# Patient Record
Sex: Male | Born: 1990 | Race: White | Hispanic: No | Marital: Single | Smoking: Former smoker
Health system: Southern US, Community
[De-identification: ages and names within clinical notes are randomized; demographics above are authoritative.]

## PROBLEM LIST (undated history)

## (undated) DIAGNOSIS — J9383 Other pneumothorax: Secondary | ICD-10-CM

## (undated) DIAGNOSIS — J45909 Unspecified asthma, uncomplicated: Secondary | ICD-10-CM

## (undated) DIAGNOSIS — T7840XA Allergy, unspecified, initial encounter: Secondary | ICD-10-CM

## (undated) DIAGNOSIS — J939 Pneumothorax, unspecified: Secondary | ICD-10-CM

---
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
## 2012-09-06 ENCOUNTER — Encounter (HOSPITAL_COMMUNITY): Payer: Self-pay | Admitting: *Deleted

## 2012-09-06 ENCOUNTER — Emergency Department (INDEPENDENT_AMBULATORY_CARE_PROVIDER_SITE_OTHER)
Admission: EM | Admit: 2012-09-06 | Discharge: 2012-09-06 | Disposition: A | Discharge status: Home / Self Care | Payer: Medicaid Other | Source: Home / Self Care

## 2012-09-06 DIAGNOSIS — A088 Other specified intestinal infections: Secondary | ICD-10-CM

## 2012-09-06 DIAGNOSIS — A084 Viral intestinal infection, unspecified: Secondary | ICD-10-CM

## 2012-09-06 MED ORDER — ONDANSETRON 4 MG PO TBDP
4.0000 mg | ORAL_TABLET | Freq: Once | ORAL | Status: AC
  Administered 2012-09-06: 4 mg via ORAL

## 2012-09-06 MED ORDER — ONDANSETRON 4 MG PO TBDP
ORAL_TABLET | ORAL | Status: AC
  Filled 2012-09-06: qty 1

## 2012-09-06 MED ORDER — ONDANSETRON HCL 4 MG PO TABS: 4.0000 mg | ORAL_TABLET | Freq: Four times a day (QID) | ORAL | Status: DC

## 2012-09-06 NOTE — ED Provider Notes (Signed)
Medical screening examination/treatment/procedure(s) were performed by non-physician practitioner and as supervising physician I was immediately available for consultation/collaboration.  Raynald Blend, MD 09/06/12 1718

## 2012-09-06 NOTE — ED Provider Notes (Signed)
History     CSN: [id number]  Arrival date & time 09/06/12  1312   First MD Initiated Contact with Patient 09/06/12 1405      Chief Complaint  Patient presents with  . Emesis    (Consider location/radiation/quality/duration/timing/severity/associated sxs/prior treatment) HPI Comments: 22 year old male presents with vomiting and diarrhea starting last p.m. He has had vomiting 6 times now and loose stools 7 times. He denies having fever, chills. But he does have malaise and fatigue. He is also complaining of generalized abdominal cramping. He denies sore throat, earache, fever. He also notes he has a chronic GI condition in which he has for loose stools almost on a daily basis. Sometimes he will have days of constipation.   History reviewed. No pertinent past medical history.  History reviewed. No pertinent past surgical history.  No family history on file.  History  Substance Use Topics  . Smoking status: Current Every Day Smoker  . Smokeless tobacco: Not on file  . Alcohol Use: Yes      Review of Systems  Constitutional: Positive for activity change and fatigue. Negative for fever and diaphoresis.  HENT: Negative for ear pain, congestion, sore throat, neck pain and neck stiffness.   Eyes: Negative.   Respiratory: Negative for cough, chest tightness and shortness of breath.   Cardiovascular: Negative.   Gastrointestinal: Positive for nausea, vomiting, abdominal pain and diarrhea. Negative for constipation and blood in stool.       As per history of present illness  Genitourinary: Negative.   Musculoskeletal: Negative.   Skin: Negative for color change and rash.  Neurological: Negative.   Psychiatric/Behavioral: Negative.  Negative for behavioral problems.    Allergies  Review of patient's allergies indicates no known allergies.  Home Medications   Current Outpatient Rx  Name  Route  Sig  Dispense  Refill  . ondansetron (ZOFRAN) 4 MG tablet   Oral   Take 1  tablet (4 mg total) by mouth every 6 (six) hours.   12 tablet   0     BP 118/72  Pulse 72  Temp(Src) 98.6 F (37 C) (Oral)  Resp 16  SpO2 100%  Physical Exam  Constitutional: He is oriented to person, place, and time. He appears well-developed and well-nourished. No distress.  Eyes: Conjunctivae and EOM are normal.  Neck: Normal range of motion. Neck supple.  Cardiovascular: Normal rate, regular rhythm and normal heart sounds.   Pulmonary/Chest: Effort normal and breath sounds normal. No respiratory distress. He has no wheezes. He has no rales.  Abdominal: Soft. He exhibits no distension and no mass. There is tenderness. There is guarding. There is no rebound.  Abdomen with mild to moderate generalized tenderness. He tends to guard with palpation. No localized tenderness over the right lower quadrant. Tenderness across the abdomen is the same without a focal point of tenderness.  Musculoskeletal: Normal range of motion. He exhibits no edema and no tenderness.  Lymphadenopathy:    He has no cervical adenopathy.  Neurological: He is alert and oriented to person, place, and time. No cranial nerve deficit. He exhibits normal muscle tone. Coordination normal.  Skin: Skin is warm and dry. No rash noted.  Psychiatric: He has a normal mood and affect.    ED Course  Procedures (including critical care time)  Labs Reviewed - No data to display No results found.   1. Viral gastroenteritis       MDM  Patient is discharged in stable condition. He is early  in the first day of vomiting and diarrhea associated with the local epidemic of viral gastroenteritis. Lunesta Zofran 4 mg by mouth now and a prescription for 4 mg every 6 hours as needed for nausea and vomiting. Imodium A-D 1 nail and then in 6 hours if still having greater than 4-5 stools per day. Do not take like the directions M. a box and did not take enough to stop the diarrhea. This is the way the body she is a virus. Clear  liquid diet for 24 hours and slowly advance as directed. Obtain a primary care doctor as soon as possible for followup. If worse, new symptoms or problems such as fever, increased pain, persistent and unrelenting and vomiting and diarrhea they need to return or go to emergency department.         Hayden Rasmussen, NP 09/06/12 1554

## 2012-09-06 NOTE — ED Notes (Signed)
Pt  Reports    Symptoms  Of  Nausea   Vomiting    Diarrhea    Since  yest   Vomited approx  5  X  Today  approx  6  Loose  Stools         Also has  A  Rash on l lower  Leg x  1  Year           He  Stated  He  Had         Scabies      approx  1  yearb  Ago    While  Incarcerated  In that  Area       But  He  Was  Treated

## 2014-07-02 ENCOUNTER — Encounter (HOSPITAL_COMMUNITY): Payer: Self-pay | Admitting: Emergency Medicine

## 2014-07-02 ENCOUNTER — Emergency Department (INDEPENDENT_AMBULATORY_CARE_PROVIDER_SITE_OTHER)
Admission: EM | Admit: 2014-07-02 | Discharge: 2014-07-02 | Disposition: A | Discharge status: Home / Self Care | Payer: Self-pay | Source: Home / Self Care | Attending: Emergency Medicine | Admitting: Emergency Medicine

## 2014-07-02 DIAGNOSIS — R112 Nausea with vomiting, unspecified: Secondary | ICD-10-CM

## 2014-07-02 LAB — COMPREHENSIVE METABOLIC PANEL
ALBUMIN: 4.9 g/dL (ref 3.5–5.2)
ALT: 21 U/L (ref 0–53)
AST: 21 U/L (ref 0–37)
Alkaline Phosphatase: 105 U/L (ref 39–117)
Anion gap: 10 (ref 5–15)
BUN: 14 mg/dL (ref 6–23)
CALCIUM: 9.9 mg/dL (ref 8.4–10.5)
CO2: 27 mmol/L (ref 19–32)
CREATININE: 0.98 mg/dL (ref 0.50–1.35)
Chloride: 102 mEq/L (ref 96–112)
GFR calc Af Amer: >90 mL/min (ref >90)
GFR calc non Af Amer: >90 mL/min (ref >90)
GLUCOSE: 97 mg/dL (ref 70–99)
POTASSIUM: 4.1 mmol/L (ref 3.5–5.1)
SODIUM: 139 mmol/L (ref 135–145)
Total Bilirubin: 1.2 mg/dL (ref 0.3–1.2)
Total Protein: 7.6 g/dL (ref 6.0–8.3)

## 2014-07-02 LAB — POCT H PYLORI SCREEN: H. PYLORI SCREEN, POC: NEGATIVE

## 2014-07-02 MED ORDER — ONDANSETRON 4 MG PO TBDP
4.0000 mg | ORAL_TABLET | Freq: Once | ORAL | Status: AC
  Administered 2014-07-02: 4 mg via ORAL

## 2014-07-02 MED ORDER — ONDANSETRON 4 MG PO TBDP
ORAL_TABLET | ORAL | Status: AC
  Filled 2014-07-02: qty 1

## 2014-07-02 MED ORDER — ONDANSETRON HCL 4 MG PO TABS: 4.0000 mg | ORAL_TABLET | Freq: Four times a day (QID) | ORAL | Status: DC

## 2014-07-02 NOTE — Discharge Instructions (Signed)
Take zofran every 6 hours as needed for vomiting. Avoid fatty foods. Please make an appointment with the GI doctor for additional evaluation.

## 2014-07-02 NOTE — ED Provider Notes (Signed)
CSN: [id number]     Arrival date & time 07/02/14  1018 History   First MD Initiated Contact with Patient 07/02/14 1103     Chief Complaint  Patient presents with  . Nausea   (Consider location/radiation/quality/duration/timing/severity/associated sxs/prior Treatment) HPI  He is a 24 year old man here for evaluation of nausea and vomiting. He states this started last night with vomiting and diffuse abdominal pain. There is no blood in the vomit. He also reports to watery bowel movements this morning. No fevers or chills. He states this is a recurrent issue over the last 4 years. Recently, it has been occurring more frequently, at least once a month. Episodes typically last a few days. He states it seems to be triggered by greasy and fatty foods.  He also states he will often be constipated for the week before an episode occurs.  He denies any blood in the stool or weight loss.  History reviewed. No pertinent past medical history. History reviewed. No pertinent past surgical history. No family history on file. History  Substance Use Topics  . Smoking status: Current Every Day Smoker  . Smokeless tobacco: Not on file  . Alcohol Use: Yes    Review of Systems  Constitutional: Negative for fever, chills and unexpected weight change.  Gastrointestinal: Positive for nausea, vomiting, abdominal pain, diarrhea and constipation.    Allergies  Review of patient's allergies indicates no known allergies.  Home Medications   Prior to Admission medications   Medication Sig Start Date End Date Taking? Authorizing Provider  ondansetron (ZOFRAN) 4 MG tablet Take 1 tablet (4 mg total) by mouth every 6 (six) hours. 07/02/14   Charm RingsErin J Gaberiel Youngblood, MD   BP 126/69 mmHg  Pulse 60  Temp(Src) 98.1 F (36.7 C) (Oral)  Resp 14  SpO2 100% Physical Exam  Constitutional: He is oriented to person, place, and time. He appears well-developed and well-nourished.  Cardiovascular: Normal rate, regular rhythm and normal  heart sounds.   Pulmonary/Chest: Effort normal and breath sounds normal. No respiratory distress. He has no wheezes. He has no rales.  Abdominal: Soft. Bowel sounds are normal. He exhibits no distension. There is tenderness (diffuse). There is no rebound and no guarding.  Negative Murphy's and McBurneys.  Neurological: He is alert and oriented to person, place, and time.    ED Course  Procedures (including critical care time) Labs Review Labs Reviewed  COMPREHENSIVE METABOLIC PANEL  POCT H PYLORI SCREEN    Imaging Review No results found.   MDM   1. Non-intractable vomiting with nausea, vomiting of unspecified type    Treated with Zofran 4 mg ODT here with some improvement.  Blood work is all normal.  Suspect he may have some sort of food sensitivity or gallbladder issue with this recurrent abdominal pain with nausea and vomiting. Zofran as needed at home. Follow-up with GI doctor for additional evaluation.  Meds ordered this encounter  Medications  . ondansetron (ZOFRAN-ODT) disintegrating tablet 4 mg    Sig:   . ondansetron (ZOFRAN) 4 MG tablet    Sig: Take 1 tablet (4 mg total) by mouth every 6 (six) hours.    Dispense:  20 tablet    Refill:  0     Charm RingsErin J Ziyah Cordoba, MD 07/02/14 1301

## 2014-07-02 NOTE — ED Notes (Signed)
Abdominal pain, nausea, and vomiting.  Sometimes has diarrhea with episodes.  Reports having repeated episodes of these complaints for 4 years.  Reports they occur every 2 weeks.  This episode started last night

## 2015-03-13 ENCOUNTER — Encounter (HOSPITAL_COMMUNITY): Payer: Self-pay | Admitting: Emergency Medicine

## 2015-03-13 ENCOUNTER — Emergency Department (INDEPENDENT_AMBULATORY_CARE_PROVIDER_SITE_OTHER)
Admission: EM | Admit: 2015-03-13 | Discharge: 2015-03-13 | Disposition: A | Discharge status: Home / Self Care | Payer: Self-pay | Source: Home / Self Care | Attending: Emergency Medicine | Admitting: Emergency Medicine

## 2015-03-13 DIAGNOSIS — R0789 Other chest pain: Secondary | ICD-10-CM

## 2015-03-13 DIAGNOSIS — S29011A Strain of muscle and tendon of front wall of thorax, initial encounter: Secondary | ICD-10-CM

## 2015-03-13 MED ORDER — DICLOFENAC POTASSIUM 50 MG PO TABS: 50.0000 mg | ORAL_TABLET | Freq: Three times a day (TID) | ORAL | Status: DC

## 2015-03-13 NOTE — ED Provider Notes (Signed)
CSN: [id number]     Arrival date & time 03/13/15  1728 History   First MD Initiated Contact with Patient 03/13/15 1757     Chief Complaint  Patient presents with  . Chest Pain   (Consider location/radiation/quality/duration/timing/severity/associated sxs/prior Treatment) HPI Comments: 24 year old male complaining of right chest pain starts at the [facility] and radiates toward the right. Pain is worse with palpation and movement of the arm and in particular abduction. It is worse with movement of the torso and the right arm. Started 2-3 days ago. It does not hurt with taking a deep breath. It is not associated with shortness of breath, diaphoresis, nausea or abdominal pain. His job does require use of the right arm frequently and occasionally exercises.  Patient is a 24 y.o. male presenting with chest pain. The history is provided by the patient.  Chest Pain Pain location:  R chest and R lateral chest Pain quality: aching, sharp and stabbing   Pain radiates to:  Does not radiate Pain radiates to the back: no   Pain severity:  Moderate Onset quality:  Gradual Duration:  2 days Timing:  Constant Progression:  Worsening Chronicity:  New Context: lifting, movement and raising an arm   Relieved by:  None tried Worsened by:  Movement Associated symptoms: no abdominal pain, no back pain, no cough, no dizziness, no fatigue, no fever, no palpitations, no shortness of breath, no syncope and not vomiting     History reviewed. No pertinent past medical history. History reviewed. No pertinent past surgical history. No family history on file. Social History  Substance Use Topics  . Smoking status: Current Every Day Smoker  . Smokeless tobacco: None  . Alcohol Use: Yes    Review of Systems  Constitutional: Negative for fever and fatigue.  HENT: Negative.   Respiratory: Negative for cough and shortness of breath.   Cardiovascular: Positive for chest pain. Negative for palpitations and  syncope.  Gastrointestinal: Negative for vomiting and abdominal pain.  Musculoskeletal: Positive for myalgias. Negative for back pain.  Skin: Negative.   Neurological: Negative for dizziness.  All other systems reviewed and are negative.   Allergies  Review of patient's allergies indicates no known allergies.  Home Medications   Prior to Admission medications   Medication Sig Start Date End Date Taking? Authorizing Provider  diclofenac (CATAFLAM) 50 MG tablet Take 1 tablet (50 mg total) by mouth 3 (three) times daily. One tablet TID with food prn pain. 03/13/15   Hayden Rasmussen, NP  ondansetron (ZOFRAN) 4 MG tablet Take 1 tablet (4 mg total) by mouth every 6 (six) hours. 07/02/14   Charm Rings, MD   Meds Ordered and Administered this Visit  Medications - No data to display  BP 114/77 mmHg  Pulse 52  Temp(Src) 98.1 F (36.7 C) (Oral)  Resp 16  SpO2 100% No data found.   Physical Exam  Constitutional: He appears well-developed and well-nourished. No distress.  Neck: Normal range of motion. Neck supple.  Cardiovascular: Normal rate, regular rhythm and normal heart sounds.   Pulmonary/Chest: Effort normal and breath sounds normal. No respiratory distress. He exhibits tenderness.  Reproducible Chest wall tenderness from the right sternal border across the right anterior chest. Pain is reproduced with right arm adduction.  Musculoskeletal: He exhibits no edema.  Neurological: No cranial nerve deficit. He exhibits normal muscle tone.  Skin: Skin is warm and dry.  Nursing note and vitals reviewed.   ED Course  Procedures (including critical care time)  Labs Review Labs Reviewed - No data to display  Imaging Review No results found.   Visual Acuity Review  Right Eye Distance:   Left Eye Distance:   Bilateral Distance:    Right Eye Near:   Left Eye Near:    Bilateral Near:         MDM   1. Right-sided chest wall pain   2. Muscle strain of chest wall, initial  encounter    Ice several times a day cataflam 50 mg as dir Limit activity for a few days until better    Hayden Rasmussen, NP 03/13/15 1832

## 2015-03-13 NOTE — Discharge Instructions (Signed)
Chest Wall Pain °Chest wall pain is pain in or around the bones and muscles of your chest. It may take up to 6 weeks to get better. It may take longer if you must stay physically active in your work and activities.  °CAUSES  °Chest wall pain may happen on its own. However, it may be caused by: °· A viral illness like the flu. °· Injury. °· Coughing. °· Exercise. °· Arthritis. °· Fibromyalgia. °· Shingles. °HOME CARE INSTRUCTIONS  °· Avoid overtiring physical activity. Try not to strain or perform activities that cause pain. This includes any activities using your chest or your abdominal and side muscles, especially if heavy weights are used. °· Put ice on the sore area. °· Put ice in a plastic bag. °· Place a towel between your skin and the bag. °· Leave the ice on for 15-20 minutes per hour while awake for the first 2 days. °· Only take over-the-counter or prescription medicines for pain, discomfort, or fever as directed by your caregiver. °SEEK IMMEDIATE MEDICAL CARE IF:  °· Your pain increases, or you are very uncomfortable. °· You have a fever. °· Your chest pain becomes worse. °· You have new, unexplained symptoms. °· You have nausea or vomiting. °· You feel sweaty or lightheaded. °· You have a cough with phlegm (sputum), or you cough up blood. °MAKE SURE YOU:  °· Understand these instructions. °· Will watch your condition. °· Will get help right away if you are not doing well or get worse. °Document Released: 06/04/2005 Document Revised: 08/27/2011 Document Reviewed: 01/29/2011 °ExitCare® Patient Information ©2015 [employer]. This information is not intended to replace advice given to you by your health care provider. Make sure you discuss any questions you have with your health care provider. ° °Musculoskeletal Pain °Musculoskeletal pain is muscle and boney aches and pains. These pains can occur in any part of the body. Your caregiver may treat you without knowing the cause of the pain. They may treat you  if blood or urine tests, X-rays, and other tests were normal.  °CAUSES °There is often not a definite cause or reason for these pains. These pains may be caused by a type of germ (virus). The discomfort may also come from overuse. Overuse includes working out too hard when your body is not fit. Boney aches also come from weather changes. Bone is sensitive to atmospheric pressure changes. °HOME CARE INSTRUCTIONS  °· Ask when your test results will be ready. Make sure you get your test results. °· Only take over-the-counter or prescription medicines for pain, discomfort, or fever as directed by your caregiver. If you were given medications for your condition, do not drive, operate machinery or power tools, or sign legal documents for 24 hours. Do not drink alcohol. Do not take sleeping pills or other medications that may interfere with treatment. °· Continue all activities unless the activities cause more pain. When the pain lessens, slowly resume normal activities. Gradually increase the intensity and duration of the activities or exercise. °· During periods of severe pain, bed rest may be helpful. Lay or sit in any position that is comfortable. °· Putting ice on the injured area. °¨ Put ice in a bag. °¨ Place a towel between your skin and the bag. °¨ Leave the ice on for 15 to 20 minutes, 3 to 4 times a day. °· Follow up with your caregiver for continued problems and no reason can be found for the pain. If the pain becomes worse   or does not go away, it may be necessary to repeat tests or do additional testing. Your caregiver may need to look further for a possible cause. °SEEK IMMEDIATE MEDICAL CARE IF: °· You have pain that is getting worse and is not relieved by medications. °· You develop chest pain that is associated with shortness or breath, sweating, feeling sick to your stomach (nauseous), or throw up (vomit). °· Your pain becomes localized to the abdomen. °· You develop any new symptoms that seem different  or that concern you. °MAKE SURE YOU:  °· Understand these instructions. °· Will watch your condition. °· Will get help right away if you are not doing well or get worse. °Document Released: 06/04/2005 Document Revised: 08/27/2011 Document Reviewed: 02/06/2013 °ExitCare® Patient Information ©2015 [employer]. This information is not intended to replace advice given to you by your health care provider. Make sure you discuss any questions you have with your health care provider. ° °

## 2015-03-13 NOTE — ED Notes (Signed)
Pain in center chest that spreads to the right side of chest.  Onset 2 days ago.  Patient was gradual at first, now has grown in intensity, gradually over the past few days.  No specific injury.  Patient works at [employer] and works out occasionally.  Pain in chest worsens with movement of torso or in attempt to raise right arm.

## 2016-11-04 ENCOUNTER — Emergency Department (HOSPITAL_COMMUNITY)
Admission: EM | Admit: 2016-11-04 | Discharge: 2016-11-04 | Disposition: A | Discharge status: Home / Self Care | Payer: Medicaid Other | Attending: Emergency Medicine | Admitting: Emergency Medicine

## 2016-11-04 ENCOUNTER — Encounter (HOSPITAL_COMMUNITY): Payer: Self-pay | Admitting: Emergency Medicine

## 2016-11-04 DIAGNOSIS — Z79899 Other long term (current) drug therapy: Secondary | ICD-10-CM | POA: Insufficient documentation

## 2016-11-04 DIAGNOSIS — L02411 Cutaneous abscess of right axilla: Secondary | ICD-10-CM | POA: Insufficient documentation

## 2016-11-04 DIAGNOSIS — F17200 Nicotine dependence, unspecified, uncomplicated: Secondary | ICD-10-CM | POA: Insufficient documentation

## 2016-11-04 MED ORDER — HYDROCODONE-ACETAMINOPHEN 5-325 MG PO TABS
1.0000 | ORAL_TABLET | Freq: Once | ORAL | Status: AC
  Administered 2016-11-04: 1 via ORAL
  Filled 2016-11-04: qty 1

## 2016-11-04 MED ORDER — SULFAMETHOXAZOLE-TRIMETHOPRIM 800-160 MG PO TABS: 1.0000 | ORAL_TABLET | Freq: Two times a day (BID) | ORAL | 0 refills | Status: AC

## 2016-11-04 MED ORDER — LIDOCAINE HCL (PF) 1 % IJ SOLN
5.0000 mL | Freq: Once | INTRAMUSCULAR | Status: AC
  Administered 2016-11-04: 5 mL
  Filled 2016-11-04: qty 5

## 2016-11-04 NOTE — ED Triage Notes (Signed)
Pt. Stated, I have an abscess under rt. Arm for almost a week.

## 2016-11-04 NOTE — ED Provider Notes (Signed)
[facility]-EMERGENCY DEPT Provider Note   CSN: [id number] Arrival date & time: 11/04/16  0757     History   Chief Complaint Chief Complaint  Patient presents with  . Abscess    HPI Jonathan Wilkins is a 26 y.o. male with no past medical history presenting with worsening abscess under of his right axilla. He has attempted to squeeze it and drain it himself without success. He reports improvement with hot showers. The pain has caused him to have difficulty lifting his arm all the way up above his head. He has tried Aleve without relief. Denies IV drug use, fever, chills, or other symptoms.  HPI  History reviewed. No pertinent past medical history.  There are no active problems to display for this patient.   History reviewed. No pertinent surgical history.     Home Medications    Prior to Admission medications   Medication Sig Start Date End Date Taking? Authorizing Provider  diclofenac (CATAFLAM) 50 MG tablet Take 1 tablet (50 mg total) by mouth 3 (three) times daily. One tablet TID with food prn pain. 03/13/15   Hayden RasmussenMabe, David, NP  ondansetron (ZOFRAN) 4 MG tablet Take 1 tablet (4 mg total) by mouth every 6 (six) hours. 07/02/14   Charm RingsHonig, Erin J, MD  sulfamethoxazole-trimethoprim (BACTRIM DS,SEPTRA DS) 800-160 MG tablet Take 1 tablet by mouth 2 (two) times daily. 11/04/16 11/11/16  Georgiana ShoreMitchell, Vernesha Talbot B, PA-C    Family History No family history on file.  Social History Social History  Substance Use Topics  . Smoking status: Current Every Day Smoker  . Smokeless tobacco: Current User  . Alcohol use Yes     Allergies   Patient has no known allergies.   Review of Systems Review of Systems  Constitutional: Negative for chills, fatigue and fever.  Respiratory: Negative for shortness of breath, wheezing and stridor.   Cardiovascular: Negative for chest pain and palpitations.  Gastrointestinal: Negative for nausea and vomiting.  Musculoskeletal: Positive for myalgias.  Negative for neck pain and neck stiffness.  Skin: Positive for color change. Negative for pallor and rash.  Neurological: Negative for dizziness, weakness, light-headedness and numbness.     Physical Exam Updated Vital Signs BP 112/86 (BP Location: Left Arm)   Pulse 82   Temp 97.9 F (36.6 C) (Oral)   Resp 18   Ht 5\' 9"  (1.753 m)   SpO2 98%   Physical Exam  Constitutional: He appears well-developed and well-nourished. No distress.  Afebrile, nontoxic-appearing, lying comfortably in bed in no acute distress.  HENT:  Head: Normocephalic and atraumatic.  Eyes: Conjunctivae and EOM are normal. Right eye exhibits no discharge. Left eye exhibits no discharge.  Neck: Normal range of motion.  Cardiovascular: Normal rate, regular rhythm, normal heart sounds and intact distal pulses.   No murmur heard. Pulmonary/Chest: Effort normal and breath sounds normal. No respiratory distress.  Abdominal: He exhibits no distension.  Musculoskeletal: He exhibits tenderness. He exhibits no edema or deformity.  Difficulty raising his arm above his head due to pain  Neurological: He is alert. No sensory deficit.  Skin: Skin is warm and dry. No rash noted. He is not diaphoretic. There is erythema. No pallor.  Right axilla erythema and induration with central region of fluctuance and TTP  Psychiatric: He has a normal mood and affect.  Nursing note and vitals reviewed.    ED Treatments / Results  Labs (all labs ordered are listed, but only abnormal results are displayed) Labs Reviewed - No  data to display  EKG  EKG Interpretation None       Radiology No results found.  Procedures Procedures (including critical care time) EMERGENCY DEPARTMENT US SOFT TISSUE INTERPRETATION "Study: Limited Soft Tissue Ultrasound"  INDICATIONS: Soft tissue infection Multiple views of the body part were obtained in real-time with a multi-frequency linear probe  PERFORMED BY: Myself IMAGES ARCHIVED?:  Yes SIDE:Right  BODY PART:Axilla INTERPRETATION:  Abcess present and Cellulitis present  INCISION AND DRAINAGE Performed by: Georgiana Shore Consent: Verbal consent obtained. Risks and benefits: risks, benefits and alternatives were discussed Type: abscess  Body area: Right axilla  Anesthesia: local infiltration  Incision was made with a scalpel.  Local anesthetic: lidocaine 1% Without epinephrine  Anesthetic total: 2 ml  Complexity: complex Blunt dissection to break up loculations  Drainage: purulent  Drainage amount: 5mL  Packing material: 1/4 in iodoform gauze  Patient tolerance: Patient tolerated the procedure well with no immediate complications.    Patient tolerance: Patient tolerated the procedure well with no immediate complications. Medications Ordered in ED Medications  HYDROcodone-acetaminophen (NORCO/VICODIN) 5-325 MG per tablet 1 tablet (1 tablet Oral Given 11/04/16 0920)  lidocaine (PF) (XYLOCAINE) 1 % injection 5 mL (5 mLs Infiltration Given 11/04/16 0920)     Initial Impression / Assessment and Plan / ED Course  I have reviewed the triage vital signs and the nursing notes.  Pertinent labs & imaging results that were available during my care of the patient were reviewed by me and considered in my medical decision making (see chart for details).    Patient presented with right axilla abscess and surrounding cellulitis (~4x3cm) visualized on ultrasound. No circumferential cellulitis of upper extremity.   Incision and drainage performed. Patient reported significant improvement in pain after I&D in pain and pressure and was able to move his arm again.  Will discharge home with antibiotics and close follow-up for wound recheck in 48 hours. Patient was otherwise well-appearing, nontoxic and afebrile. No co-morbidities to affect wound healing.  Patient with skin abscess amenable to incision and drainage.  Abscess was large enough to warrant packing,   wound recheck in 2 days. Encouraged home warm soaks and flushing.  Moderate signs of cellulitis is surrounding skin.  Will d/c to home.   Discussed strict return precautions and advised to return to the emergency department if experiencing any new or worsening symptoms. Instructions were understood and patient agreed with discharge plan.   Final Clinical Impressions(s) / ED Diagnoses   Final diagnoses:  Abscess of axilla, right    New Prescriptions Discharge Medication List as of 11/04/2016  9:55 AM    START taking these medications   Details  sulfamethoxazole-trimethoprim (BACTRIM DS,SEPTRA DS) 800-160 MG tablet Take 1 tablet by mouth 2 (two) times daily., Starting Sun 11/04/2016, Until Sun 11/11/2016, Print         Georgiana Shore, PA-C 11/04/16 1239    Mancel Bale, MD 11/04/16 [phone]

## 2016-11-04 NOTE — Discharge Instructions (Signed)
As discussed, Keep the area clean and dry. You may start using warm compresses after 24 hours. Have wound rechecked in 48 hours. Take your antibiotics as prescribed even if you feel better. Ibuprofen or Tylenol for pain.  Return to the emergency department if he experienced fever, chills, worsening pain, swelling, purulent discharge, or any other new concerning symptoms in the meantime.

## 2017-06-26 ENCOUNTER — Inpatient Hospital Stay (HOSPITAL_BASED_OUTPATIENT_CLINIC_OR_DEPARTMENT_OTHER)
Admission: EM | Admit: 2017-06-26 | Discharge: 2017-06-28 | DRG: 200 | Disposition: A | Discharge status: Home / Self Care | Payer: Self-pay | Attending: Thoracic Surgery (Cardiothoracic Vascular Surgery) | Admitting: Thoracic Surgery (Cardiothoracic Vascular Surgery)

## 2017-06-26 ENCOUNTER — Other Ambulatory Visit: Payer: Self-pay

## 2017-06-26 ENCOUNTER — Encounter (HOSPITAL_BASED_OUTPATIENT_CLINIC_OR_DEPARTMENT_OTHER): Payer: Self-pay | Admitting: *Deleted

## 2017-06-26 ENCOUNTER — Emergency Department (HOSPITAL_BASED_OUTPATIENT_CLINIC_OR_DEPARTMENT_OTHER): Payer: Self-pay

## 2017-06-26 DIAGNOSIS — F17290 Nicotine dependence, other tobacco product, uncomplicated: Secondary | ICD-10-CM | POA: Diagnosis present

## 2017-06-26 DIAGNOSIS — J9383 Other pneumothorax: Principal | ICD-10-CM

## 2017-06-26 DIAGNOSIS — T797XXA Traumatic subcutaneous emphysema, initial encounter: Secondary | ICD-10-CM | POA: Diagnosis present

## 2017-06-26 DIAGNOSIS — J939 Pneumothorax, unspecified: Secondary | ICD-10-CM

## 2017-06-26 DIAGNOSIS — Z09 Encounter for follow-up examination after completed treatment for conditions other than malignant neoplasm: Secondary | ICD-10-CM

## 2017-06-26 DIAGNOSIS — R001 Bradycardia, unspecified: Secondary | ICD-10-CM | POA: Diagnosis present

## 2017-06-26 DIAGNOSIS — X58XXXA Exposure to other specified factors, initial encounter: Secondary | ICD-10-CM | POA: Diagnosis present

## 2017-06-26 DIAGNOSIS — J9311 Primary spontaneous pneumothorax: Secondary | ICD-10-CM

## 2017-06-26 HISTORY — DX: Other pneumothorax: J93.83

## 2017-06-26 HISTORY — DX: Unspecified asthma, uncomplicated: J45.909

## 2017-06-26 HISTORY — PX: CHEST TUBE INSERTION: SHX231

## 2017-06-26 LAB — BASIC METABOLIC PANEL
ANION GAP: 12 (ref 5–15)
BUN: 18 mg/dL (ref 6–20)
CHLORIDE: 103 mmol/L (ref 101–111)
CO2: 23 mmol/L (ref 22–32)
Calcium: 10 mg/dL (ref 8.9–10.3)
Creatinine, Ser: 0.92 mg/dL (ref 0.61–1.24)
GFR calc non Af Amer: >60 mL/min (ref >60)
Glucose, Bld: 90 mg/dL (ref 65–99)
POTASSIUM: 3.6 mmol/L (ref 3.5–5.1)
Sodium: 138 mmol/L (ref 135–145)

## 2017-06-26 LAB — CBC WITH DIFFERENTIAL/PLATELET
Basophils Absolute: 0 10*3/uL (ref 0.0–0.1)
Basophils Relative: 0 %
Eosinophils Absolute: 0.1 10*3/uL (ref 0.0–0.7)
Eosinophils Relative: 1 %
HCT: 47.4 % (ref 39.0–52.0)
HEMOGLOBIN: 16.4 g/dL (ref 13.0–17.0)
LYMPHS ABS: 2.1 10*3/uL (ref 0.7–4.0)
LYMPHS PCT: 11 %
MCH: 30.7 pg (ref 26.0–34.0)
MCHC: 34.6 g/dL (ref 30.0–36.0)
MCV: 88.8 fL (ref 78.0–100.0)
Monocytes Absolute: 1.5 10*3/uL — ABNORMAL HIGH (ref 0.1–1.0)
Monocytes Relative: 8 %
NEUTROS ABS: 15.7 10*3/uL — AB (ref 1.7–7.7)
Neutrophils Relative %: 80 %
Platelets: 256 10*3/uL (ref 150–400)
RBC: 5.34 MIL/uL (ref 4.22–5.81)
RDW: 13.2 % (ref 11.5–15.5)
WBC: 19.5 10*3/uL — AB (ref 4.0–10.5)

## 2017-06-26 MED ORDER — OXYCODONE HCL 5 MG PO TABS: 5.0000 mg | ORAL_TABLET | ORAL | Status: DC | PRN

## 2017-06-26 MED ORDER — ONDANSETRON HCL 4 MG PO TABS
4.0000 mg | ORAL_TABLET | Freq: Four times a day (QID) | ORAL | Status: DC
  Administered 2017-06-26 – 2017-06-28 (×4): 4 mg via ORAL
  Filled 2017-06-26 (×4): qty 1

## 2017-06-26 MED ORDER — FENTANYL CITRATE (PF) 100 MCG/2ML IJ SOLN
50.0000 ug | Freq: Once | INTRAMUSCULAR | Status: AC
  Administered 2017-06-26: 50 ug via INTRAVENOUS
  Filled 2017-06-26: qty 2

## 2017-06-26 MED ORDER — HYDROMORPHONE HCL 1 MG/ML IJ SOLN: 1.0000 mg | Freq: Once | INTRAMUSCULAR | Status: DC

## 2017-06-26 MED ORDER — LORAZEPAM 2 MG/ML IJ SOLN
INTRAMUSCULAR | Status: AC
Start: 2017-06-26 — End: 2017-06-27
  Filled 2017-06-26: qty 1

## 2017-06-26 MED ORDER — LIDOCAINE-EPINEPHRINE (PF) 2 %-1:200000 IJ SOLN
20.0000 mL | Freq: Once | INTRAMUSCULAR | Status: AC
  Administered 2017-06-26: 20 mL via INTRADERMAL
  Filled 2017-06-26: qty 20

## 2017-06-26 MED ORDER — OXYCODONE HCL 5 MG PO TABS
10.0000 mg | ORAL_TABLET | ORAL | Status: DC | PRN
  Administered 2017-06-26 – 2017-06-28 (×6): 10 mg via ORAL
  Filled 2017-06-26 (×6): qty 2

## 2017-06-26 MED ORDER — LORAZEPAM 2 MG/ML IJ SOLN
0.5000 mg | Freq: Once | INTRAMUSCULAR | Status: AC
  Administered 2017-06-26: 0.5 mg via INTRAVENOUS

## 2017-06-26 MED ORDER — LORAZEPAM 2 MG/ML IJ SOLN
0.5000 mg | Freq: Once | INTRAMUSCULAR | Status: AC
  Administered 2017-06-26: 0.5 mg via INTRAVENOUS
  Filled 2017-06-26: qty 1

## 2017-06-26 MED ORDER — FENTANYL CITRATE (PF) 100 MCG/2ML IJ SOLN
50.0000 ug | Freq: Once | INTRAMUSCULAR | Status: AC
Start: 2017-06-26 — End: 2017-06-26
  Administered 2017-06-26: 50 ug via INTRAVENOUS

## 2017-06-26 MED ORDER — FENTANYL CITRATE (PF) 100 MCG/2ML IJ SOLN: 50.0000 ug | Freq: Once | INTRAMUSCULAR | Status: AC

## 2017-06-26 MED ORDER — FENTANYL CITRATE (PF) 100 MCG/2ML IJ SOLN
INTRAMUSCULAR | Status: AC
  Filled 2017-06-26: qty 2

## 2017-06-26 MED ORDER — ACETAMINOPHEN 500 MG PO TABS: 1000.0000 mg | ORAL_TABLET | Freq: Four times a day (QID) | ORAL | Status: DC | PRN

## 2017-06-26 MED ORDER — FENTANYL CITRATE (PF) 100 MCG/2ML IJ SOLN
INTRAMUSCULAR | Status: AC
  Administered 2017-06-26: 50 ug via INTRAVENOUS
  Filled 2017-06-26: qty 2

## 2017-06-26 NOTE — Progress Notes (Signed)
Arrived to unit via [facility] services in stable condition. Right lateral chest tube present upon admit. Oriented to unit & staff. Dr. Dorris FetchHendrickson paged and notified of arrival. Awaiting orders at this time. Family at bedside. Will continue to monitor.

## 2017-06-26 NOTE — Progress Notes (Signed)
PA at bedside.

## 2017-06-26 NOTE — ED Notes (Signed)
Patient reports left sided body pain.  States that the only time he gets relief is when he lays on top of his washing machine.

## 2017-06-26 NOTE — ED Triage Notes (Addendum)
Pt c/o URi symptoms with congestion and facial pain  x 3 hrs

## 2017-06-26 NOTE — ED Notes (Signed)
ED Provider at bedside. 

## 2017-06-26 NOTE — ED Provider Notes (Signed)
[facility] EMERGENCY DEPARTMENT Provider Note   CSN: [id number] Arrival date & time: 06/26/17  1305     History   Chief Complaint Chief Complaint  Patient presents with  . URI    HPI Jonathan Wilkins is a 27 y.o. male who presents emergency department chief complaint of right-sided chest pain.  Patient states that he was in his shower today and leaned over to turn up the cold water.  He had sudden onset of severe right-sided chest pain which he described as squeezing coming from the front wrapping around into his back.  He has had a persistent cough for several months but it seemed a bit worse today.  He denies hemoptysis.  He has pain whenever he tries to breathe or move.  He rates his pain at 10 out of 10.  He denies current shortness of breath.  He is a current daily smoker.  Lifting.  HPI  History reviewed. No pertinent past medical history.  There are no active problems to display for this patient.   History reviewed. No pertinent surgical history.     Home Medications    Prior to Admission medications   Medication Sig Start Date End Date Taking? Authorizing Provider  ondansetron (ZOFRAN) 4 MG tablet Take 1 tablet (4 mg total) by mouth every 6 (six) hours. 07/02/14   Charm Rings, MD    Family History No family history on file.  Social History Social History   Tobacco Use  . Smoking status: Former Smoker    Packs/day: 0.50    Last attempt to quit: 05/19/2017    Years since quitting: 0.1  . Smokeless tobacco: Current User  Substance Use Topics  . Alcohol use: Yes  . Drug use: No     Allergies   Patient has no known allergies.   Review of Systems Review of Systems Ten systems reviewed and are negative for acute change, except as noted in the HPI.    Physical Exam Updated Vital Signs BP 121/71   Pulse 60   Temp 98.2 F (36.8 C)   Resp 18   Ht 5\' 9"  (1.753 m)   Wt 68 kg (150 lb)   SpO2 100%   BMI 22.15 kg/m   Physical Exam    Constitutional: He appears well-developed and well-nourished. No distress.  HENT:  Head: Normocephalic and atraumatic.  Eyes: Conjunctivae are normal. No scleral icterus.  Neck: Normal range of motion. Neck supple.  Cardiovascular: Normal rate, regular rhythm and normal heart sounds.  Pulmonary/Chest: Effort normal. No respiratory distress.  Bracing against breathing.  Decreased breath sounds on the right side in all lung fields  Abdominal: Soft. Bowel sounds are normal. There is no tenderness.  Musculoskeletal: Normal range of motion. He exhibits no edema.  Tenderness to palpation along the right posterior and anterior chest wall.  This reproduces pain.  Neurological: He is alert.  Skin: Skin is warm and dry. He is not diaphoretic.  Psychiatric: His behavior is normal.  Nursing note and vitals reviewed.    ED Treatments / Results  Labs (all labs ordered are listed, but only abnormal results are displayed) Labs Reviewed  CBC WITH DIFFERENTIAL/PLATELET - Abnormal; Notable for the following components:      Result Value   WBC 19.5 (*)    Neutro Abs 15.7 (*)    Monocytes Absolute 1.5 (*)    All other components within normal limits  BASIC METABOLIC PANEL    EKG  EKG  Interpretation None       Radiology Dg Chest 2 View  Result Date: 06/26/2017 CLINICAL DATA:  RIGHT-sided chest pain.  Smoker. EXAM: CHEST  2 VIEW COMPARISON:  None. FINDINGS: Normal cardiac silhouette. Large volume RIGHT pneumothorax occupies approximately 60 - 70% of the hemithorax volume. Trachea is midline. No clear evidence of midline shift. LEFT lung is clear. No fracture or bony abnormality identified. IMPRESSION: Large volume RIGHT pneumothorax.  No evidence of mediastinal shift. Critical Value/emergent results were called by telephone at the time of interpretation on 06/26/2017 at 4:26 pm to Dr. Arthor CaptainABIGAIL Marquisa Salih , who verbally acknowledged these results. Electronically Signed   By: Genevive BiStewart  Edmunds M.D.   On:  06/26/2017 16:33   Dg Chest Portable 1 View  Result Date: 06/26/2017 CLINICAL DATA:  Chest tube placement.  Pneumothorax. EXAM: PORTABLE CHEST 1 VIEW COMPARISON:  06/26/2017 FINDINGS: New right-sided chest tube from right lateral approach, entering between the lateral fifth and sixth ribs is identified. The tip projects over the medial left upper lob, tip projecting over the posterior left sixth rib. Tiny apical pneumothorax measuring 3 mm in thickness is noted. The left lung remains clear. Heart and mediastinal contours are within normal limits. IMPRESSION: 1. New right-sided chest tube with tip projecting over the posterior right sixth rib. 2. Marked decrease in right-sided pneumothorax status post chest tube placement with only a tiny 3 mm thick right apical pneumothorax now visualized. Electronically Signed   By: Tollie Ethavid  Kwon M.D.   On: 06/26/2017 17:37    Procedures CHEST TUBE INSERTION Date/Time: 06/26/2017 6:07 PM Performed by: Arthor CaptainHarris, Shenay Torti, PA-C Authorized by: Arthor CaptainHarris, Octavion Mollenkopf, PA-C   Consent:    Consent obtained:  Verbal and written   Consent given by:  Patient and parent   Risks discussed:  Bleeding, incomplete drainage, pain and infection Pre-procedure details:    Skin preparation:  ChloraPrep   Preparation: Patient was prepped and draped in the usual sterile fashion   Sedation:    Sedation type:  Anxiolysis Anesthesia (see MAR for exact dosages):    Anesthesia method:  Local infiltration   Local anesthetic:  Lidocaine 1% w/o epi Procedure details:    Placement location:  R lateral   Scalpel size:  11   Tube size ([place]): 14 french.   Ultrasound guidance: no     Tension pneumothorax: no     Tube connected to:  Suction   Drainage characteristics:  Air only   Suture material:  0 silk   Dressing: biofilm. Post-procedure details:    Patient tolerance of procedure:  Tolerated with difficulty .Critical Care Performed by: Arthor CaptainHarris, Tomy Khim, PA-C Authorized by: Arthor CaptainHarris, Eberardo Demello,  PA-C   Critical care provider statement:    Critical care time (minutes):  60   Critical care was necessary to treat or prevent imminent or life-threatening deterioration of the following conditions: 70% spontaneous pneumothorax.   Critical care was time spent personally by me on the following activities:  Re-evaluation of patient's condition, pulse oximetry, ordering and review of radiographic studies, ordering and review of laboratory studies, ordering and performing treatments and interventions, obtaining history from patient or surrogate, examination of patient, evaluation of patient's response to treatment, discussions with consultants, development of treatment plan with patient or surrogate and blood draw for specimens   (including critical care time)  Medications Ordered in ED Medications  LORazepam (ATIVAN) injection 0.5 mg (0.5 mg Intravenous Given 06/26/17 1646)  fentaNYL (SUBLIMAZE) injection 50 mcg (50 mcg Intravenous Given 06/26/17 1646)  lidocaine-EPINEPHrine (  XYLOCAINE W/EPI) 2 %-1:200000 (PF) injection 20 mL (20 mLs Intradermal Given by Other 06/26/17 1645)  LORazepam (ATIVAN) injection 0.5 mg (0.5 mg Intravenous Given 06/26/17 1656)  fentaNYL (SUBLIMAZE) injection 50 mcg (50 mcg Intravenous Given 06/26/17 1723)  Patient noted to have 60-70% pneumothorax on x-ray.  I spoke with radiology about these findings.  His oxygen saturations are normal he has had stable vital signs throughout.   Initial Impression / Assessment and Plan / ED Course  I have reviewed the triage vital signs and the nursing notes.  Pertinent labs & imaging results that were available during my care of the patient were reviewed by me and considered in my medical decision making (see chart for details).     Chest x-ray shows reinflation of the right lung to its entirety.  After chest tube placement the patient is feeling greatly improved.  Patient who will be admitted by CT surgery.  Patient was seen and shared visit  with bedside assistance of chest tube placement with Dr. Criss Alvine.  Given handout of patient care to Dr. Criss Alvine  Final Clinical Impressions(s) / ED Diagnoses   Final diagnoses:  Spontaneous pneumothorax    ED Discharge Orders    None       Arthor Captain, PA-C 06/26/17 1814    Pricilla Loveless, MD 06/27/17 [phone]

## 2017-06-26 NOTE — H&P (Signed)
Subjective:  Patient is a 27 year old male who presented to the emergency department at [facility] with right-sided chest discomfort.  He was in the shower earlier today when he leaned over and noticed a sudden onset of severe right-sided chest pain which she described as squeezing in nature and radiated into the back.  He was found to have a pneumothorax and a chest tube was placed and he was [facility] for cardiothoracic surgical management of the tube and potential further interventions as required.  Her primary symptoms currently are pain with movement and deep breaths.  He does have a history of tobacco abuse and is a daily smoker.  He has no significant other medical history.  He does have an intermittent productive cough over the past several months and occasionally produces clear sputum.    Patient Active Problem List   Diagnosis Date Noted  . Spontaneous pneumothorax 06/26/2017   History reviewed. No pertinent past medical history.  History reviewed. No pertinent surgical history.  Medications Prior to Admission  Medication Sig Dispense Refill Last Dose  . ondansetron (ZOFRAN) 4 MG tablet Take 1 tablet (4 mg total) by mouth every 6 (six) hours. 20 tablet 0 Unknown at Unknown time   No Known Allergies  Social History   Tobacco Use  . Smoking status: Former Smoker    Packs/day: 0.50    Last attempt to quit: 05/19/2017    Years since quitting: 0.1  . Smokeless tobacco: Current User  Substance Use Topics  . Alcohol use: Yes    No family history on file.   NKA   Review of Systems Pertinent items are noted in HPI.  Objective:   Patient Vitals for the past 8 hrs:  BP Temp Temp src Pulse Resp SpO2 Height Weight  06/26/17 1952 126/84 97.8 F (36.6 C) Oral (!) 57 (!) 23 100 % 5\' 9"  (1.753 m) 160 lb 15 oz (73 kg)  06/26/17 1830 136/73 - - 73 20 98 % - -  06/26/17 1825 (!) 141/92 - - 79 (!) 28 100 % - -  06/26/17 1820 138/76 - - 65 19 100 % -  -  06/26/17 1815 (!) 141/90 - - 71 17 100 % - -  06/26/17 1805 (!) 131/93 - - 66 17 100 % - -  06/26/17 1800 130/85 - - 66 (!) 23 100 % - -  06/26/17 1755 132/78 - - 76 (!) 21 100 % - -  06/26/17 1750 134/86 - - 68 (!) 23 100 % - -  06/26/17 1745 121/76 - - (!) 58 20 100 % - -  06/26/17 1740 121/71 - - 60 18 100 % - -  06/26/17 1730 137/75 - - 65 18 100 % - -  06/26/17 1725 124/76 - - (!) 53 (!) 23 100 % - -  06/26/17 1720 124/83 - - - (!) 26 - - -  06/26/17 1716 (!) 115/103 - - 84 (!) 25 99 % - -  06/26/17 1710 137/69 - - 67 19 100 % - -  06/26/17 1705 119/82 - - 64 19 100 % - -  06/26/17 1700 136/82 - - 70 16 100 % - -  06/26/17 1656 124/79 - - 66 17 100 % - -  06/26/17 1643 (!) 130/100 - - 69 (!) 30 98 % - -  06/26/17 1642 130/80 - - 69 (!) 30 95 % - -  06/26/17 1312 121/76 98.2 F (36.8 C) -  67 18 97 % - -  06/26/17 1310 - - - - - - 5\' 9"  (1.753 m) 150 lb (68 kg)   No intake/output data recorded. No intake/output data recorded.   . Results for orders placed or performed during the hospital encounter of 06/26/17 (from the past 24 hour(s))  Basic metabolic panel     Status: None   Collection Time: 06/26/17  4:40 PM  Result Value Ref Range   Sodium 138 135 - 145 mmol/L   Potassium 3.6 3.5 - 5.1 mmol/L   Chloride 103 101 - 111 mmol/L   CO2 23 22 - 32 mmol/L   Glucose, Bld 90 65 - 99 mg/dL   BUN 18 6 - 20 mg/dL   Creatinine, Ser 1.61 0.61 - 1.24 mg/dL   Calcium 09.6 8.9 - 04.5 mg/dL   GFR calc non Af Amer >60 >60 mL/min   GFR calc Af Amer >60 >60 mL/min   Anion gap 12 5 - 15  CBC with Differential     Status: Abnormal   Collection Time: 06/26/17  4:40 PM  Result Value Ref Range   WBC 19.5 (H) 4.0 - 10.5 K/uL   RBC 5.34 4.22 - 5.81 MIL/uL   Hemoglobin 16.4 13.0 - 17.0 g/dL   HCT 40.9 81.1 - 91.4 %   MCV 88.8 78.0 - 100.0 fL   MCH 30.7 26.0 - 34.0 pg   MCHC 34.6 30.0 - 36.0 g/dL   RDW 78.2 95.6 - 21.3 %   Platelets 256 150 - 400 K/uL   Neutrophils Relative % 80 %    Neutro Abs 15.7 (H) 1.7 - 7.7 K/uL   Lymphocytes Relative 11 %   Lymphs Abs 2.1 0.7 - 4.0 K/uL   Monocytes Relative 8 %   Monocytes Absolute 1.5 (H) 0.1 - 1.0 K/uL   Eosinophils Relative 1 %   Eosinophils Absolute 0.1 0.0 - 0.7 K/uL   Basophils Relative 0 %   Basophils Absolute 0.0 0.0 - 0.1 K/uL    Dg Chest 2 View  Result Date: 06/26/2017 CLINICAL DATA:  RIGHT-sided chest pain.  Smoker. EXAM: CHEST  2 VIEW COMPARISON:  None. FINDINGS: Normal cardiac silhouette. Large volume RIGHT pneumothorax occupies approximately 60 - 70% of the hemithorax volume. Trachea is midline. No clear evidence of midline shift. LEFT lung is clear. No fracture or bony abnormality identified. IMPRESSION: Large volume RIGHT pneumothorax.  No evidence of mediastinal shift. Critical Value/emergent results were called by telephone at the time of interpretation on 06/26/2017 at 4:26 pm to Dr. Arthor Captain , who verbally acknowledged these results. Electronically Signed   By: Genevive Bi M.D.   On: 06/26/2017 16:33   Dg Chest Portable 1 View  Result Date: 06/26/2017 CLINICAL DATA:  Chest tube placement.  Pneumothorax. EXAM: PORTABLE CHEST 1 VIEW COMPARISON:  06/26/2017 FINDINGS: New right-sided chest tube from right lateral approach, entering between the lateral fifth and sixth ribs is identified. The tip projects over the medial left upper lob, tip projecting over the posterior left sixth rib. Tiny apical pneumothorax measuring 3 mm in thickness is noted. The left lung remains clear. Heart and mediastinal contours are within normal limits. IMPRESSION: 1. New right-sided chest tube with tip projecting over the posterior right sixth rib. 2. Marked decrease in right-sided pneumothorax status post chest tube placement with only a tiny 3 mm thick right apical pneumothorax now visualized. Electronically Signed   By: Tollie Eth M.D.   On: 06/26/2017 17:37  BP 126/84 (BP Location: Left Arm)   Pulse (!) 57   Temp 97.8 F  (36.6 C) (Oral)   Resp (!) 23   Ht 5\' 9"  (1.753 m)   Wt 160 lb 15 oz (73 kg)   SpO2 100%   BMI 23.77 kg/m   General Appearance:    Alert, cooperative, no distress, appears stated age  Head:    Normocephalic, without obvious abnormality, atraumatic  Eyes:    PERRL,EOM's intact             Throat:   Lips, mucosa, and tongue normal; teeth and gums normal  Neck:   Supple, symmetrical, trachea midline, no adenopathy;       thyroid:  No enlargement/tenderness/nodules; no carotid   bruit or JVD  Back:     Symmetric, no curvature, ROM normal, no CVA tenderness  Lungs:     Clear to auscultation bilaterally, respirations unlabored  Chest wall:    No tenderness or deformity  Heart:    Regular rate and rhythm, S1 and S2 normal, no murmur, rub   or gallop  Abdomen:     Soft, non-tender, bowel sounds active all four quadrants,    no masses, no organomegaly  Genitalia:  deferred  Rectal:  deferred  Extremities:   Extremities normal, atraumatic, no cyanosis or edema  Pulses:   2+ and symmetric all extremities  Skin:   Skin color, texture, turgor normal, no rashes or lesions  Lymph nodes:   Cervical, supraclavicular, and axillary nodes normal  Neurologic:   CNII-XII intact. Normal strength, sensation and reflexes      throughout     Assessment:   Active Problems:   Spontaneous pneumothorax   Plan:   Routine chest tube management.  Currently he does not have an air leak.  He will be monitored closely and depending on course may require further intervention.  Patient seen and examined, agree with above Hx tobacco abuse. Acute onset CP and SOB while in shower. Found to have right spontaneous pneumothorax. CT placed at [facility] Follow CXR- lung reexpanded Pain control  Viviann Spare C. Dorris Fetch, MD [facility] [phone]

## 2017-06-26 NOTE — Progress Notes (Signed)
Dr Hendrickson at bedside.

## 2017-06-27 ENCOUNTER — Inpatient Hospital Stay (HOSPITAL_COMMUNITY): Payer: Self-pay

## 2017-06-27 NOTE — Progress Notes (Addendum)
[address]       [place] [address]             [phone]         Subjective: Feels ok, much less pain  Objective: Vital signs in last 24 hours: Temp:  [97.8 F (36.6 C)-98.2 F (36.8 C)] 98.2 F (36.8 C) (01/10 0453) Pulse Rate:  [53-84] 55 (01/10 0453) Cardiac Rhythm: Sinus bradycardia;Other (Comment) (01/09 2000) Resp:  [16-30] 17 (01/10 0453) BP: (115-141)/(69-103) 126/78 (01/10 0453) SpO2:  [95 %-100 %] 97 % (01/10 0453) Weight:  [150 lb (68 kg)-160 lb 15 oz (73 kg)] 160 lb 15 oz (73 kg) (01/09 1952)  Hemodynamic parameters for last 24 hours:    Intake/Output from previous day: No intake/output data recorded. Intake/Output this shift: No intake/output data recorded.  General appearance: alert, cooperative and no distress Heart: regular rate and rhythm Lungs: clear to auscultation bilaterally  Lab Results: Recent Labs    06/26/17 1640  WBC 19.5*  HGB 16.4  HCT 47.4  PLT 256   BMET:  Recent Labs    06/26/17 1640  NA 138  K 3.6  CL 103  CO2 23  GLUCOSE 90  BUN 18  CREATININE 0.92  CALCIUM 10.0    PT/INR: No results for input(s): LABPROT, INR in the last 72 hours. ABG No results found for: PHART, HCO3, TCO2, ACIDBASEDEF, O2SAT CBG (last 3)  No results for input(s): GLUCAP in the last 72 hours.  Meds Scheduled Meds: . ondansetron  4 mg Oral Q6H   Continuous Infusions: PRN Meds:.acetaminophen, oxyCODONE, oxyCODONE  Xrays Dg Chest 2 View  Result Date: 06/26/2017 CLINICAL DATA:  RIGHT-sided chest pain.  Smoker. EXAM: CHEST  2 VIEW COMPARISON:  None. FINDINGS: Normal cardiac silhouette. Large volume RIGHT pneumothorax occupies approximately 60 - 70% of the hemithorax volume. Trachea is midline. No clear evidence of midline shift. LEFT lung is clear. No fracture or bony abnormality identified. IMPRESSION: Large volume RIGHT pneumothorax.  No evidence of mediastinal shift. Critical Value/emergent results were called by  telephone at the time of interpretation on 06/26/2017 at 4:26 pm to Dr. Arthor Captain , who verbally acknowledged these results. Electronically Signed   By: Genevive Bi M.D.   On: 06/26/2017 16:33   Dg Chest Portable 1 View  Result Date: 06/26/2017 CLINICAL DATA:  Chest tube placement.  Pneumothorax. EXAM: PORTABLE CHEST 1 VIEW COMPARISON:  06/26/2017 FINDINGS: New right-sided chest tube from right lateral approach, entering between the lateral fifth and sixth ribs is identified. The tip projects over the medial left upper lob, tip projecting over the posterior left sixth rib. Tiny apical pneumothorax measuring 3 mm in thickness is noted. The left lung remains clear. Heart and mediastinal contours are within normal limits. IMPRESSION: 1. New right-sided chest tube with tip projecting over the posterior right sixth rib. 2. Marked decrease in right-sided pneumothorax status post chest tube placement with only a tiny 3 mm thick right apical pneumothorax now visualized. Electronically Signed   By: Tollie Eth M.D.   On: 06/26/2017 17:37    Assessment/Plan:   1 doing well, CXR is stable without PNTX , no air leak. Chest tube was paced around 4 pm yesterday- leave to suction for now and probable H2O seal later this afternoon.     LOS: 1 day    Rowe Clack 06/27/2017 Patient seen and examined, agree with above No air leak, CXR oK- CT to water seal If  no leak in AM and CXR OK will dc CT and dc home  Kemonie Cutillo C. Dorris FetchHendrickson, MD [facility] [phone]

## 2017-06-28 ENCOUNTER — Inpatient Hospital Stay (HOSPITAL_COMMUNITY): Payer: Self-pay

## 2017-06-28 MED ORDER — OXYCODONE HCL 5 MG PO TABS: ORAL_TABLET | ORAL | 0 refills | Status: DC

## 2017-06-28 NOTE — Progress Notes (Signed)
Dc instructions given to pt at this time.  Pt verbalized understanding of all instructions.  No c/o pain or s/s of any acute distress noted.  Pt leaving floor at this time to meet mother for pickup.

## 2017-06-28 NOTE — Discharge Summary (Signed)
Physician Discharge Summary  Patient ID: Jonathan Wilkins MRN: [id number] DOB/AGE: 1990/09/18 27 y.o.  Admit date: 06/26/2017 Discharge date: 06/28/2017  Admission Diagnoses: Spontaneous pneumothorax  Discharge Diagnoses:  Active Problems:   Spontaneous pneumothorax  Patient Active Problem List   Diagnosis Date Noted  . Spontaneous pneumothorax 06/26/2017    Subjective:  Patient is a 27 year old male who presented to the emergency department at [facility] with right-sided chest discomfort.  He was in the shower earlier today when he leaned over and noticed a sudden onset of severe right-sided chest pain which she described as squeezing in nature and radiated into the back.  He was found to have a pneumothorax and a chest tube was placed and he was transferred to [facility] for cardiothoracic surgical management of the tube and potential further interventions as required.  Her primary symptoms currently are pain with movement and deep breaths.  He does have a history of tobacco abuse and is a daily smoker.  He has no significant other medical history.  He does have an intermittent productive cough over the past several months and occasionally produces clear sputum.    Discharged Condition: good  Hospital Course: The patient was admitted for management of his chest tube.  Additionally, for pain control.  The chest tube was managed in a routine manner being placed on waterseal on 06/27/2017 following chest x-ray which revealed full expansion of the lung.  On 06/28/2017 the x-ray remained unremarkable and the tube was removed.  Follow up chest x ray showed some subcutaneous emphysema right lateral chest wall (seen on previous CXR) and NO pneumothorax. Patient is surgically stable for discharge today.  Consults: None  Significant Diagnostic Studies: Serial chest x-rays  Treatments: Right pigtail chest tube  Discharge Exam: Blood pressure (!) 137/93, pulse (!) 50,  temperature 98.8 F (37.1 C), temperature source Oral, resp. rate (!) 21, height 5\' 9"  (1.753 m), weight 160 lb 15 oz (73 kg), SpO2 98 %.  Cardiovascular: RRR Pulmonary: Clear to auscultation bilaterally Abdomen: Soft, non tender, bowel sounds present.   Disposition: 01-Home or Self Care   Allergies as of 06/28/2017   No Known Allergies     Medication List    TAKE these medications   ibuprofen 200 MG tablet Commonly known as:  ADVIL,MOTRIN Take 400 mg by mouth every 6 (six) hours as needed.   oxyCODONE 5 MG immediate release tablet Commonly known as:  Oxy IR/ROXICODONE Take 5 mg by mouth every 4-6 hours PRN severe pain      Follow-up Information    Loreli Slot, MD. Go on 07/08/2017.   Specialty:  Cardiothoracic Surgery Why:  PA/LAT CXR to be taken (at [facility] which is in the same building as Dr. Sunday Corn office)on 07/08/2017 at 3:30 pm;Appointment is with physician assistant and is at 4:00 pm Contact information: [address] [place] [address] [phone]          Dg Chest 2 View  Result Date: 06/28/2017 CLINICAL DATA:  Status post chest tube removal EXAM: CHEST  2 VIEW COMPARISON:  March 27, 2018 FINDINGS: Chest tube has been removed from the right side. There is subcutaneous air on the right, but no pneumothorax is evident. There is no edema or consolidation. The heart size and pulmonary vascularity are normal. No adenopathy. No bone lesions. IMPRESSION: Chest tube removed from right side with subcutaneous emphysema but no appreciable pneumothorax. No edema or consolidation. Heart size normal.  Electronically Signed   By: Bretta BangWilliam  Woodruff III M.D.   On: 06/28/2017 10:27   Dg Chest 2 View  Result Date: 06/26/2017 CLINICAL DATA:  RIGHT-sided chest pain.  Smoker. EXAM: CHEST  2 VIEW COMPARISON:  None. FINDINGS: Normal cardiac silhouette. Large volume RIGHT pneumothorax occupies approximately 60 - 70% of the hemithorax volume.  Trachea is midline. No clear evidence of midline shift. LEFT lung is clear. No fracture or bony abnormality identified. IMPRESSION: Large volume RIGHT pneumothorax.  No evidence of mediastinal shift. Critical Value/emergent results were called by telephone at the time of interpretation on 06/26/2017 at 4:26 pm to Dr. Arthor CaptainABIGAIL HARRIS , who verbally acknowledged these results. Electronically Signed   By: Genevive BiStewart  Edmunds M.D.   On: 06/26/2017 16:33   Dg Chest Port 1 View  Result Date: 06/28/2017 CLINICAL DATA:  RIGHT chest tube EXAM: PORTABLE CHEST 1 VIEW COMPARISON:  Portable exam 0624 hours compared to 06/26/2017 FINDINGS: Small bore RIGHT thoracostomy tube has been partially withdrawn since the previous exam. Stable heart size, mediastinal contours, and pulmonary vascularity. Lungs clear. No pleural effusion or pneumothorax. IMPRESSION: Partial withdrawal of RIGHT thoracostomy tube since previous exam without pneumothorax. Electronically Signed   By: Ulyses SouthwardMark  Boles M.D.   On: 06/28/2017 09:21   Dg Chest Port 1 View  Result Date: 06/27/2017 CLINICAL DATA:  Pneumothorax EXAM: PORTABLE CHEST 1 VIEW COMPARISON:  06/26/2017 FINDINGS: Pigtail catheter in the right chest is unchanged in position. No pneumothorax. Mild right lower lobe atelectasis.  No effusion.  Left lung clear. IMPRESSION: Right chest tube in place without pneumothorax. Mild right lower lobe atelectasis. Electronically Signed   By: Marlan Palauharles  Clark M.D.   On: 06/27/2017 07:42   Dg Chest Portable 1 View  Result Date: 06/26/2017 CLINICAL DATA:  Chest tube placement.  Pneumothorax. EXAM: PORTABLE CHEST 1 VIEW COMPARISON:  06/26/2017 FINDINGS: New right-sided chest tube from right lateral approach, entering between the lateral fifth and sixth ribs is identified. The tip projects over the medial left upper lob, tip projecting over the posterior left sixth rib. Tiny apical pneumothorax measuring 3 mm in thickness is noted. The left lung remains clear. Heart  and mediastinal contours are within normal limits. IMPRESSION: 1. New right-sided chest tube with tip projecting over the posterior right sixth rib. 2. Marked decrease in right-sided pneumothorax status post chest tube placement with only a tiny 3 mm thick right apical pneumothorax now visualized. Electronically Signed   By: Tollie Ethavid  Kwon M.D.   On: 06/26/2017 17:37   Signed: Ardelle Ballsonielle M Zimmerman PA-C 06/28/2017, 12:06 PM

## 2017-06-28 NOTE — Progress Notes (Signed)
dc'd CT at this time.  No complications noted.  Call bell in reach.

## 2017-06-28 NOTE — Discharge Instructions (Signed)

## 2017-06-28 NOTE — Progress Notes (Addendum)
      [address]       [place] [id number]             [phone]           Subjective: He has pain at chest tube site.  Objective: Vital signs in last 24 hours: Temp:  [98.5 F (36.9 C)-98.8 F (37.1 C)] 98.8 F (37.1 C) (01/11 0425) Pulse Rate:  [48-52] 50 (01/11 0425) Cardiac Rhythm: Sinus bradycardia (01/11 0700) Resp:  [20-22] 20 (01/11 0425) BP: (120-137)/(76-93) 137/93 (01/11 0425) SpO2:  [94 %-98 %] 98 % (01/11 0425)     Intake/Output from previous day: No intake/output data recorded.   Physical Exam:  Cardiovascular: RRR Pulmonary: Clear to auscultation bilaterally Abdomen: Soft, non tender, bowel sounds present. Chest Tube: to water seal and no air leak  Lab Results: CBC: Recent Labs    06/26/17 1640  WBC 19.5*  HGB 16.4  HCT 47.4  PLT 256   BMET:  Recent Labs    06/26/17 1640  NA 138  K 3.6  CL 103  CO2 23  GLUCOSE 90  BUN 18  CREATININE 0.92  CALCIUM 10.0    PT/INR: No results for input(s): LABPROT, INR in the last 72 hours. ABG:  INR: Will add last result for INR, ABG once components are confirmed Will add last 4 CBG results once components are confirmed  Assessment/Plan:  1. CV - SB/SR. 2.  Pulmonary - Chest tube removed yesterday. CXR this am shows  No pneumothorax and mild right basilar atelectasis.  3. Will discharge later today if follow up CXR stable  Donielle M ZimmermanPA-C 06/28/2017,7:20 AM   Patient seen and examined CT still in place- no air leak- dc this AM  Viviann SpareSteven C. Dorris FetchHendrickson, MD [facility] [phone]

## 2017-07-04 ENCOUNTER — Other Ambulatory Visit: Payer: Self-pay | Admitting: Thoracic Surgery (Cardiothoracic Vascular Surgery)

## 2017-07-04 DIAGNOSIS — J9383 Other pneumothorax: Secondary | ICD-10-CM

## 2017-07-11 ENCOUNTER — Ambulatory Visit
Admission: RE | Admit: 2017-07-11 | Discharge: 2017-07-11 | Disposition: A | Discharge status: Home / Self Care | Payer: Medicaid Other | Source: Ambulatory Visit | Attending: Thoracic Surgery (Cardiothoracic Vascular Surgery) | Admitting: Thoracic Surgery (Cardiothoracic Vascular Surgery)

## 2017-07-11 ENCOUNTER — Ambulatory Visit: Payer: Medicaid Other | Admitting: Surgical

## 2017-07-11 ENCOUNTER — Other Ambulatory Visit: Payer: Self-pay

## 2017-07-11 VITALS — BP 124/66 | HR 60 | Resp 18 | Ht 69.0 in | Wt 174.2 lb

## 2017-07-11 DIAGNOSIS — J9383 Other pneumothorax: Secondary | ICD-10-CM

## 2017-07-11 NOTE — Progress Notes (Signed)
[address]       [place] [id number]             [phone]      Jonathan Wilkins [place] Medical Record #[id number] Date of Birth: 04-Jun-1991  Referring: Pricilla LovelessGoldston, Scott, MD Primary Care: Elias Elseeade, Robert, MD Primary Cardiologist: No primary care provider on file.   Chief Complaint:   POST OP FOLLOW UP  History of Present Illness:    The patient is a 27 year old male seen in office follow-up status post spontaneous right-sided pneumothorax that required chest tube placement.  Currently he is feeling quite well with some stiffness in the right shoulder area but has returned to work and is not having any difficulties doing routine duties.  He denies shortness of breath or chest pain.  He has had no difficulty with the incision site where the chest tube was placed.  He has not had any fevers, chills or other significant constitutional symptoms.  Overall he is pleased with his progress.      Past Medical History:  Diagnosis Date  . Childhood asthma   . Spontaneous pneumothorax 06/26/2017     Social History   Tobacco Use  Smoking Status Former Smoker  . Packs/day: 0.50  . Years: 15.00  . Pack years: 7.50  . Types: Cigarettes  . Last attempt to quit: 05/19/2017  . Years since quitting: 0.1  Smokeless Tobacco Former NeurosurgeonUser  Tobacco Comment   "chewed a few times in my teens"    Social History   Substance and Sexual Activity  Alcohol Use Yes   Comment: 06/26/2017 "mainly on holidays"     No Known Allergies  Current Outpatient Medications  Medication Sig Dispense Refill  . ibuprofen (ADVIL,MOTRIN) 200 MG tablet Take 400 mg by mouth every 6 (six) hours as needed.     No current facility-administered medications for this visit.        Physical Exam: BP 124/66 (BP Location: Left Arm, Patient Position: Sitting, Cuff Size: Normal)   Pulse 60   Resp 18   Ht 5\' 9"  (1.753 m)   Wt 174 lb 3.2 oz (79 kg)   SpO2 98% Comment: RA  BMI 25.72 kg/m    General appearance: alert, cooperative and no distress Heart: regular rate and rhythm Lungs: clear to auscultation bilaterally Wound: Well-healed without evidence of infection.  I remove the chest suture.   Diagnostic Studies & Laboratory data:     Recent Radiology Findings:   Dg Chest 2 View  Result Date: 07/11/2017 CLINICAL DATA:  History of pneumothorax and right chest tube removed. EXAM: CHEST  2 VIEW COMPARISON:  06/28/2017 FINDINGS: Right chest subcutaneous gas has resolved. Both lungs are clear without a pneumothorax. Heart and mediastinum are within normal limits. Trachea is midline. No pleural effusions. Bone structures are normal. IMPRESSION: No active cardiopulmonary disease. Subcutaneous gas has resolved and no pneumothorax. Electronically Signed   By: Richarda OverlieAdam  Henn M.D.   On: 07/11/2017 15:34      Recent Lab Findings: Lab Results  Component Value Date   WBC 19.5 (H) 06/26/2017   HGB 16.4 06/26/2017   HCT 47.4 06/26/2017   PLT 256 06/26/2017   GLUCOSE 90 06/26/2017   ALT 21 07/02/2014   AST 21 07/02/2014   NA 138 06/26/2017   K 3.6 06/26/2017   CL 103 06/26/2017   CREATININE 0.92 06/26/2017   BUN 18 06/26/2017   CO2 23 06/26/2017  Assessment / Plan: The patient is doing well.  I reviewed the chest x-ray and it shows complete expansion of the lung.  There is no evidence of infiltrates or fluid.  We discussed  lifestyle modification and in particular smoking cessation.  He does understand that this can be a issue that recurs and we discussed symptoms to be aware of such as acute shortness of breath and chest pain.  We will see him again in the office on a as needed basis.          Rowe Clack, PA-C 07/11/2017 3:57 PM

## 2017-07-11 NOTE — Patient Instructions (Signed)
Given instructions regarding activity progression.

## 2018-09-16 ENCOUNTER — Encounter (HOSPITAL_COMMUNITY): Payer: Self-pay

## 2018-09-16 ENCOUNTER — Ambulatory Visit (HOSPITAL_COMMUNITY)
Admission: EM | Admit: 2018-09-16 | Discharge: 2018-09-16 | Disposition: A | Discharge status: Home / Self Care | Payer: Medicaid Other | Attending: Family Medicine | Admitting: Family Medicine

## 2018-09-16 ENCOUNTER — Other Ambulatory Visit: Payer: Self-pay

## 2018-09-16 DIAGNOSIS — J302 Other seasonal allergic rhinitis: Secondary | ICD-10-CM

## 2018-09-16 HISTORY — DX: Allergy, unspecified, initial encounter: T78.40XA

## 2018-09-16 MED ORDER — CETIRIZINE HCL 10 MG PO TABS: 10.0000 mg | ORAL_TABLET | Freq: Every day | ORAL | 0 refills | Status: DC

## 2018-09-16 MED ORDER — FLUTICASONE PROPIONATE 50 MCG/ACT NA SUSP: 1.0000 | Freq: Every day | NASAL | 2 refills | Status: DC

## 2018-09-16 NOTE — ED Triage Notes (Signed)
Pt presents for work note; Pt has chronic allergies and his employer is requesting a work note saying is not sick or contagious.

## 2018-09-16 NOTE — Discharge Instructions (Addendum)
Sending Zyrtec and Flonase to the pharmacy for her allergies It is safe for you to return to work

## 2018-09-16 NOTE — ED Provider Notes (Signed)
[facility]    CSN: [id number] Arrival date & time: 09/16/18  7322     History   Chief Complaint Chief Complaint  Patient presents with  . Work Note    HPI Jonathan Wilkins is a 28 y.o. male.   Pt is a 28 year old male that presents with allergies. He has these seasonally. He usually takes zyrtec with relief and has not taken this.  Symptoms have been constant and remain the same over the past couple days.  His employer was concerned due to the coughing, sneezing and runny nose.  He denies any associated fevers, chills, body aches, chest pain, shortness of breath.  No recent traveling or recent sick contacts.  ROS per HPI      Past Medical History:  Diagnosis Date  . Allergy   . Childhood asthma   . Spontaneous pneumothorax 06/26/2017    Patient Active Problem List   Diagnosis Date Noted  . Spontaneous pneumothorax 06/26/2017    Past Surgical History:  Procedure Laterality Date  . CHEST TUBE INSERTION Right 06/26/2017       Home Medications    Prior to Admission medications   Medication Sig Start Date End Date Taking? Authorizing Provider  cetirizine (ZYRTEC) 10 MG tablet Take 1 tablet (10 mg total) by mouth daily. 09/16/18   Sylva Overley, Gloris Manchester A, NP  fluticasone (FLONASE) 50 MCG/ACT nasal spray Place 1 spray into both nostrils daily. 09/16/18   Dahlia Byes A, NP  ibuprofen (ADVIL,MOTRIN) 200 MG tablet Take 400 mg by mouth every 6 (six) hours as needed.    [provider]    Family History History reviewed. No pertinent family history.  Social History Social History   Tobacco Use  . Smoking status: Former Smoker    Packs/day: 0.50    Years: 15.00    Pack years: 7.50    Types: Cigarettes    Last attempt to quit: 05/19/2017    Years since quitting: 1.3  . Smokeless tobacco: Former Neurosurgeon  . Tobacco comment: "chewed a few times in my teens"  Substance Use Topics  . Alcohol use: Yes    Comment: 06/26/2017 "mainly on holidays"  . Drug  use: Yes    Types: Marijuana    Comment: 06/26/2017 "~ qd"     Allergies   Patient has no known allergies.   Review of Systems Review of Systems   Physical Exam Triage Vital Signs ED Triage Vitals [09/16/18 1016]  Enc Vitals Group     BP 120/63     Pulse Rate 74     Resp 20     Temp 98.4 F (36.9 C)     Temp Source Oral     SpO2 100 %     Weight      Height      Head Circumference      Peak Flow      Pain Score 0     Pain Loc      Pain Edu?      Excl. in GC?    No data found.  Updated Vital Signs BP 120/63 (BP Location: Left Arm)   Pulse 74   Temp 98.4 F (36.9 C) (Oral)   Resp 20   SpO2 100%   Visual Acuity Right Eye Distance:   Left Eye Distance:   Bilateral Distance:    Right Eye Near:   Left Eye Near:    Bilateral Near:     Physical Exam Vitals  signs and nursing note reviewed.  Constitutional:      General: He is not in acute distress.    Appearance: Normal appearance. He is not ill-appearing, toxic-appearing or diaphoretic.  HENT:     Head: Normocephalic and atraumatic.     Right Ear: Tympanic membrane and ear canal normal.     Left Ear: Tympanic membrane and ear canal normal.     Nose: Congestion and rhinorrhea present.     Mouth/Throat:     Pharynx: Oropharynx is clear.  Eyes:     Conjunctiva/sclera: Conjunctivae normal.  Neck:     Musculoskeletal: Normal range of motion.  Cardiovascular:     Rate and Rhythm: Normal rate and regular rhythm.     Pulses: Normal pulses.     Heart sounds: Normal heart sounds.  Pulmonary:     Effort: Pulmonary effort is normal.     Breath sounds: Normal breath sounds.  Musculoskeletal: Normal range of motion.  Skin:    General: Skin is warm and dry.  Neurological:     General: No focal deficit present.     Mental Status: He is alert.  Psychiatric:        Mood and Affect: Mood normal.      UC Treatments / Results  Labs (all labs ordered are listed, but only abnormal results are displayed) Labs  Reviewed - No data to display  EKG None  Radiology No results found.  Procedures Procedures (including critical care time)  Medications Ordered in UC Medications - No data to display  Initial Impression / Assessment and Plan / UC Course  I have reviewed the triage vital signs and the nursing notes.  Pertinent labs & imaging results that were available during my care of the patient were reviewed by me and considered in my medical decision making (see chart for details).     Seasonal allergies Zyrtec and Flonase for symptoms Work note given Follow up as needed for continued or worsening symptoms  Final Clinical Impressions(s) / UC Diagnoses   Final diagnoses:  Seasonal allergies     Discharge Instructions     Sending Zyrtec and Flonase to the pharmacy for her allergies It is safe for you to return to work    ED Prescriptions    Medication Sig Dispense Auth. Provider   fluticasone (FLONASE) 50 MCG/ACT nasal spray Place 1 spray into both nostrils daily. 16 g Blaklee Shores A, NP   cetirizine (ZYRTEC) 10 MG tablet Take 1 tablet (10 mg total) by mouth daily. 30 tablet Dahlia Byes A, NP     Controlled Substance Prescriptions [place] Controlled Substance Registry consulted? Not Applicable   Janace Aris, NP 09/16/18 1042

## 2018-10-28 IMAGING — DX DG CHEST 2V
2 series · 2 of 2 positions shown · non-contrast
Comparison: 06/28/2017

CLINICAL DATA: History of pneumothorax and right chest tube
removed.

EXAM:
CHEST  2 VIEW

[dg chest 2 view (1 of 2)]
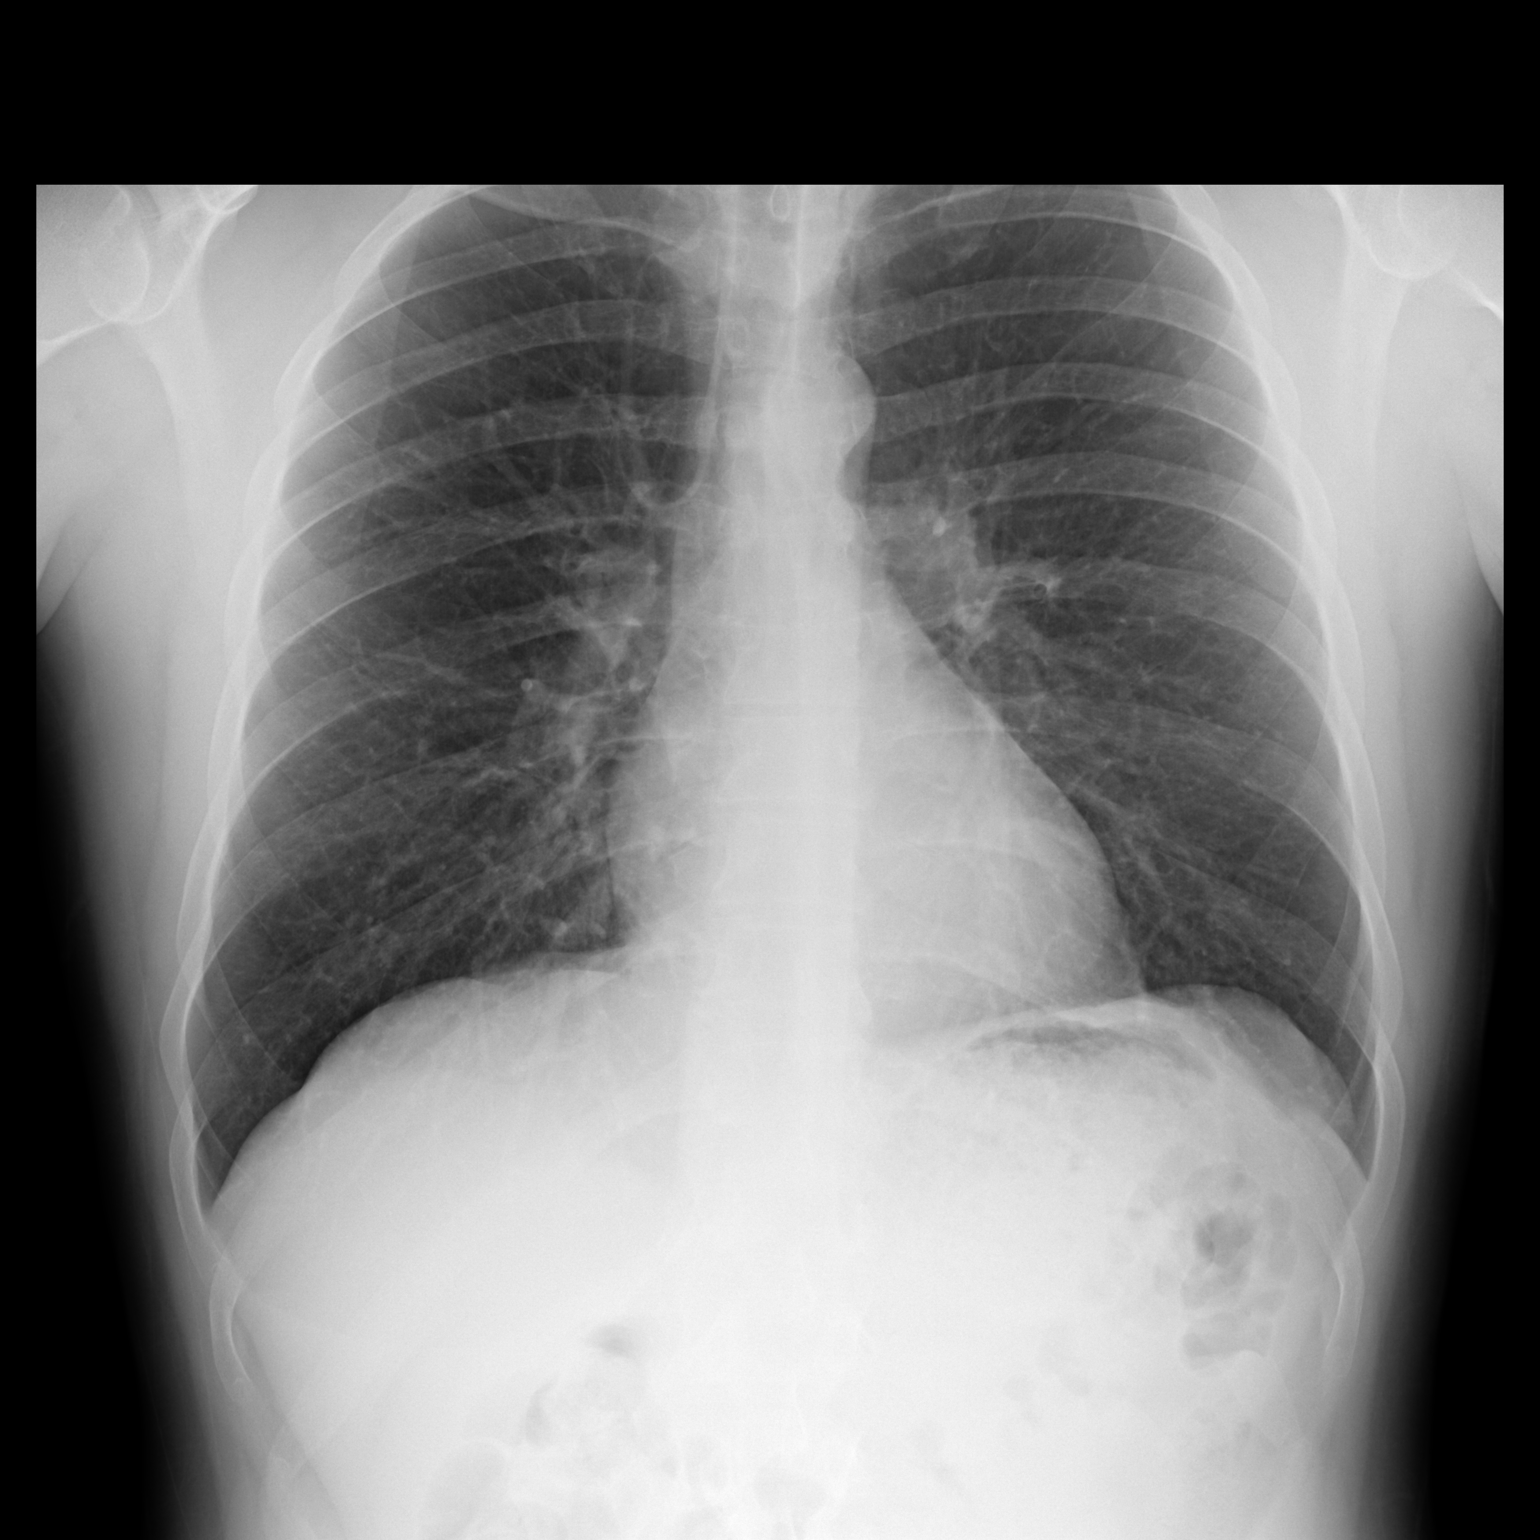

[dg chest 2 view (2 of 2)]
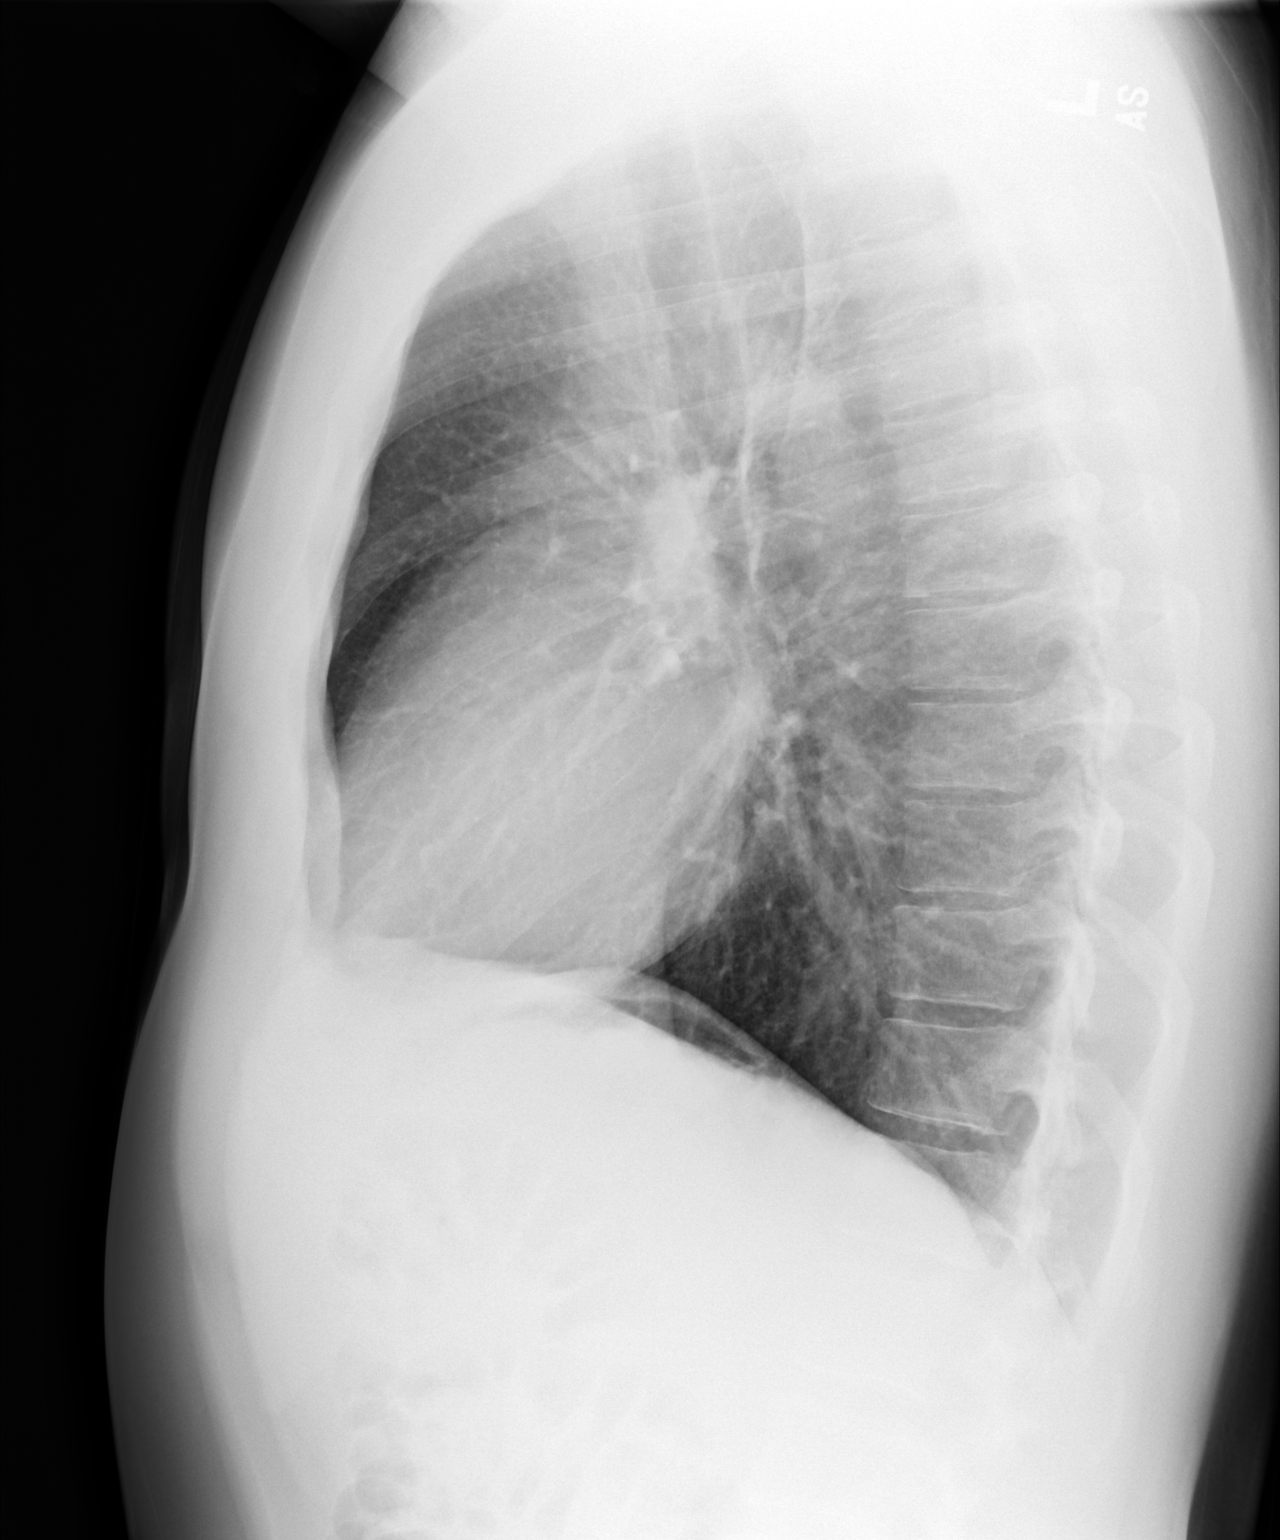

[2 of 2 positions shown; findings below may reference images not displayed]

FINDINGS: Right chest subcutaneous gas has resolved. Both lungs are clear
without a pneumothorax. Heart and mediastinum are within normal
limits. Trachea is midline. No pleural effusions. Bone structures
are normal.
IMPRESSION: No active cardiopulmonary disease.

Subcutaneous gas has resolved and no pneumothorax.

## 2023-10-17 ENCOUNTER — Encounter (HOSPITAL_COMMUNITY): Payer: Self-pay | Admitting: Emergency Medicine

## 2023-10-17 ENCOUNTER — Ambulatory Visit (HOSPITAL_COMMUNITY)
Admission: EM | Admit: 2023-10-17 | Discharge: 2023-10-17 | Disposition: A | Discharge status: Home / Self Care | Attending: Emergency Medicine | Admitting: Emergency Medicine

## 2023-10-17 DIAGNOSIS — S39012A Strain of muscle, fascia and tendon of lower back, initial encounter: Secondary | ICD-10-CM | POA: Diagnosis not present

## 2023-10-17 DIAGNOSIS — S161XXA Strain of muscle, fascia and tendon at neck level, initial encounter: Secondary | ICD-10-CM

## 2023-10-17 MED ORDER — LIDOCAINE 5 % EX PTCH: 1.0000 | MEDICATED_PATCH | CUTANEOUS | 0 refills | Status: DC

## 2023-10-17 MED ORDER — BACLOFEN 10 MG PO TABS: 10.0000 mg | ORAL_TABLET | Freq: Three times a day (TID) | ORAL | 0 refills | Status: DC

## 2023-10-17 NOTE — Discharge Instructions (Addendum)
 Take baclofen  3 times daily as needed for muscle pain and spasms.  This can make you drowsy so do not drive, work, or drink alcohol while taking this. You can apply lidocaine  patch once daily for 12 hours at a time as needed for pain as well. Alternate between 650 mg of Tylenol  and 400 mg of ibuprofen every 6-8 hours as needed for breakthrough pain. You could also apply heat and ice as needed for pain and do some gentle stretching to prevent becoming stiff. I have attached [place] [facility] that you can follow-up with if your pain persists. Return here as needed.
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
## 2024-02-09 ENCOUNTER — Ambulatory Visit (HOSPITAL_COMMUNITY)
Admission: EM | Admit: 2024-02-09 | Discharge: 2024-02-09 | Disposition: A | Discharge status: Home / Self Care | Attending: Emergency Medicine | Admitting: Emergency Medicine

## 2024-02-09 ENCOUNTER — Encounter (HOSPITAL_COMMUNITY): Payer: Self-pay

## 2024-02-09 DIAGNOSIS — S060XAA Concussion with loss of consciousness status unknown, initial encounter: Secondary | ICD-10-CM

## 2024-02-09 DIAGNOSIS — S39012A Strain of muscle, fascia and tendon of lower back, initial encounter: Secondary | ICD-10-CM | POA: Diagnosis not present

## 2024-02-09 DIAGNOSIS — M545 Low back pain, unspecified: Secondary | ICD-10-CM

## 2024-02-09 HISTORY — DX: Pneumothorax, unspecified: J93.9

## 2024-02-09 MED ORDER — BACLOFEN 10 MG PO TABS: 10.0000 mg | ORAL_TABLET | Freq: Three times a day (TID) | ORAL | 0 refills | Status: AC

## 2024-02-09 MED ORDER — KETOROLAC TROMETHAMINE 30 MG/ML IJ SOLN
30.0000 mg | Freq: Once | INTRAMUSCULAR | Status: AC
  Administered 2024-02-09: 30 mg via INTRAMUSCULAR

## 2024-02-09 MED ORDER — ACETAMINOPHEN 325 MG PO TABS
975.0000 mg | ORAL_TABLET | Freq: Once | ORAL | Status: AC
  Administered 2024-02-09: 975 mg via ORAL

## 2024-02-09 MED ORDER — KETOROLAC TROMETHAMINE 30 MG/ML IJ SOLN
INTRAMUSCULAR | Status: AC
  Filled 2024-02-09: qty 1

## 2024-02-09 MED ORDER — NAPROXEN 500 MG PO TABS: 500.0000 mg | ORAL_TABLET | Freq: Two times a day (BID) | ORAL | 0 refills | Status: AC

## 2024-02-09 MED ORDER — ACETAMINOPHEN 325 MG PO TABS
ORAL_TABLET | ORAL | Status: AC
  Filled 2024-02-09: qty 3

## 2024-02-09 MED ORDER — ACETAMINOPHEN 500 MG PO TABS: 1000.0000 mg | ORAL_TABLET | Freq: Three times a day (TID) | ORAL | 0 refills | Status: AC

## 2024-02-09 NOTE — ED Notes (Signed)
 Reviewed work note.  Patient states understanding.  Reviewed provider note/instructions .  Patient states understanding

## 2024-02-09 NOTE — Discharge Instructions (Addendum)
 I have enclosed some information about concussions and acute back pain that I hope you find helpful.  The mainstay of therapy for musculoskeletal pain is reduction of inflammation and relaxation of tension which is causing inflammation.  Keep in mind, pain always begets more pain.  To help you stay ahead of your pain and inflammation, I have provided the following regimen for you:   During your visit today, you received an injection of ketorolac , high-dose nonsteroidal anti-inflammatory pain medication that should significantly reduce your pain for the next 6 to 8 hours.   You were also provided with Tylenol  (acetaminophen ).  Please continue taking Tylenol  1000 mg every 8 hours to keep your pain well controlled, your next dose should be due around 8 PM tonight. Please know that It is safe to take a maximum 3000 mg of Tylenol  in a 24-hour period.  Please do not exceed this amount.  Tylenol  works best when taken on a scheduled basis.   When you pick up your prescription from the pharmacy, please begin taking baclofen  10 mg.  This is a highly effective muscle relaxer and antispasmodic which should continue to provide you with relaxation of your tense muscles, allow you to sleep well and to keep your pain under control.   This evening, please begin taking naproxen  500 mg twice daily.  Please keep in mind that it is always easier to treat a little bit of pain that is to treat a lot of pain.  I recommend that for the next several days, you take this medication on a scheduled basis.  After that, take it when you begin to feel the pain returning, do not wait until you are in a lot of pain.   During the day, please set aside time to apply ice to your lower back and any other area that feels sore 4 times daily for 20 minutes each application.  This can be achieved by using a bag of frozen peas or corn, a Ziploc bag filled with ice and water, or Ziploc bag filled with half rubbing alcohol and half Dawn dish  detergent, frozen into a slush.  Please be careful not to apply ice directly to your skin, always place a soft cloth between you and the ice pack.  Over-the-counter products such as IcyHot and Biofreeze do not work nearly as well.   Please avoid attempts to stretch or strengthen the affected area until you are feeling completely pain-free.  Attempts to do so will only prolong the healing process.   I also recommend that you remain out of work for the next several days, I provided you with a note to return to work in 3 days.  If you feel that you need this time extended, please follow-up with your primary care provider or return to urgent care for reevaluation so that we can provide you with a note for another 3 days.  If you begin to have persistent headaches, dizziness, nausea, vomiting or altered mental status, please go to the emergency room for further evaluation.  CT scan of your head may be indicated to rule out bleeding in the brain.   Thank you for visiting [facility] today.  We appreciate the opportunity to participate in your care.
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
